# Patient Record
Sex: Female | Born: 1960 | Hispanic: No | Marital: Single
Health system: Southern US, Community
[De-identification: ages and names within clinical notes are randomized; demographics above are authoritative.]

---

## 2016-08-27 ENCOUNTER — Other Ambulatory Visit: Payer: Self-pay | Admitting: Internal Medicine

## 2016-08-27 DIAGNOSIS — N6320 Unspecified lump in the left breast, unspecified quadrant: Secondary | ICD-10-CM

## 2016-10-19 ENCOUNTER — Other Ambulatory Visit: Payer: Self-pay | Admitting: Internal Medicine

## 2016-10-19 DIAGNOSIS — N6320 Unspecified lump in the left breast, unspecified quadrant: Secondary | ICD-10-CM

## 2016-10-27 ENCOUNTER — Other Ambulatory Visit: Payer: Self-pay | Admitting: Internal Medicine

## 2016-10-27 DIAGNOSIS — N6320 Unspecified lump in the left breast, unspecified quadrant: Secondary | ICD-10-CM

## 2016-11-10 ENCOUNTER — Ambulatory Visit
Admission: RE | Admit: 2016-11-10 | Discharge: 2016-11-10 | Disposition: A | Discharge status: Home / Self Care | Payer: Medicare Other | Source: Ambulatory Visit | Attending: Internal Medicine | Admitting: Internal Medicine

## 2016-11-10 DIAGNOSIS — N6320 Unspecified lump in the left breast, unspecified quadrant: Secondary | ICD-10-CM

## 2016-12-08 ENCOUNTER — Ambulatory Visit
Admission: RE | Admit: 2016-12-08 | Discharge: 2016-12-08 | Disposition: A | Discharge status: Home / Self Care | Payer: Medicare Other | Source: Ambulatory Visit | Attending: Internal Medicine | Admitting: Internal Medicine

## 2016-12-08 DIAGNOSIS — N6320 Unspecified lump in the left breast, unspecified quadrant: Secondary | ICD-10-CM

## 2018-06-12 IMAGING — MG MM CLIP PLACEMENT
6 series · 6 of 14 positions shown · non-contrast
Comparison: Previous exam(s).

CLINICAL DATA: Post stereotactic core needle biopsy of left breast
mass.

EXAM:
DIAGNOSTIC LEFT MAMMOGRAM POST STEREOTACTIC BIOPSY

[L CC synth-2D]
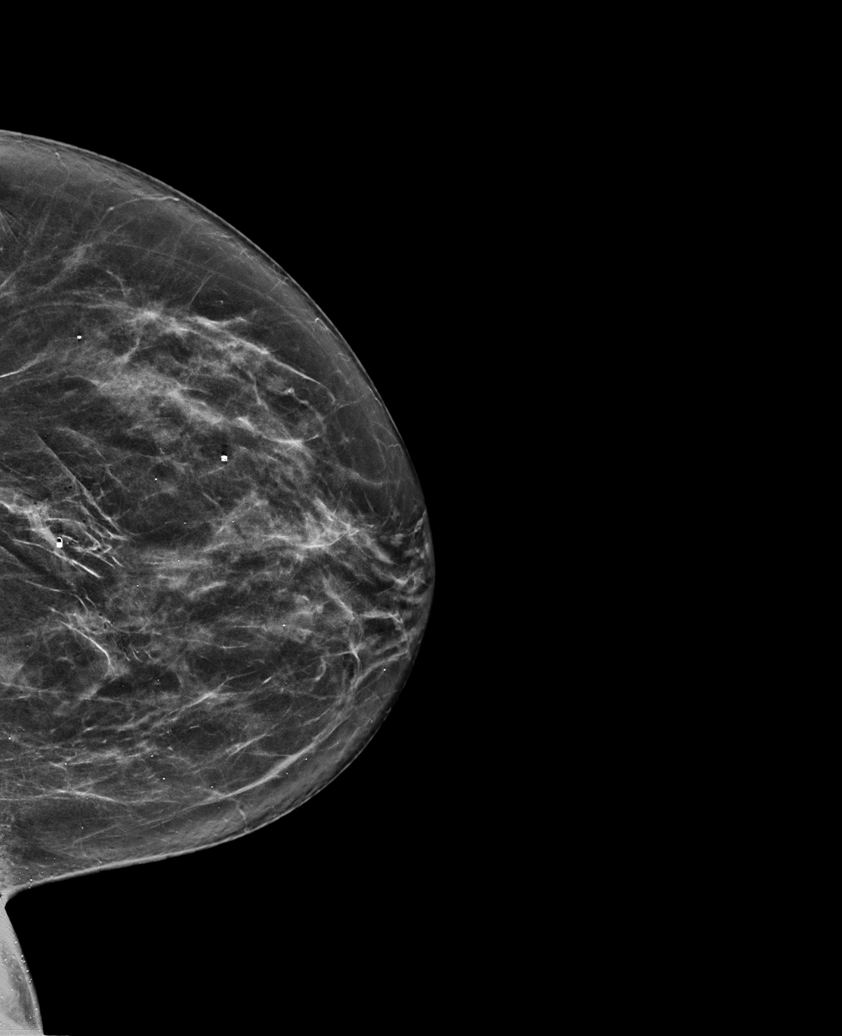

[L ML]
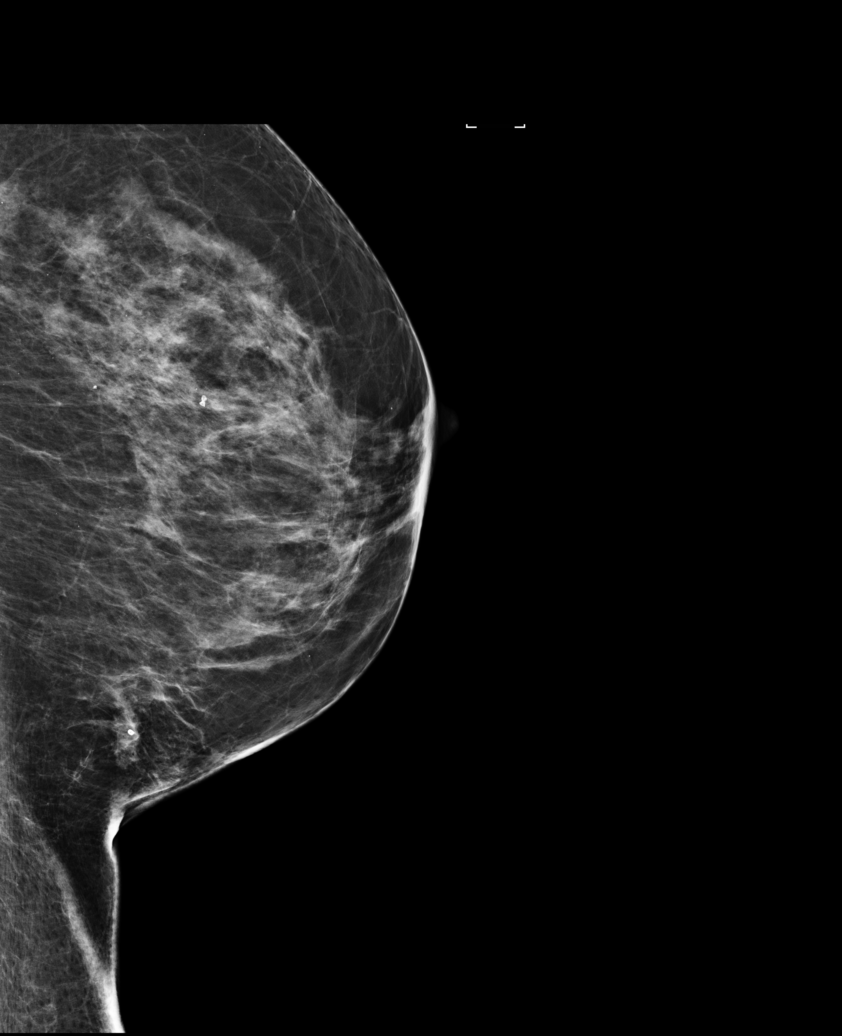

[L ML synth-2D]
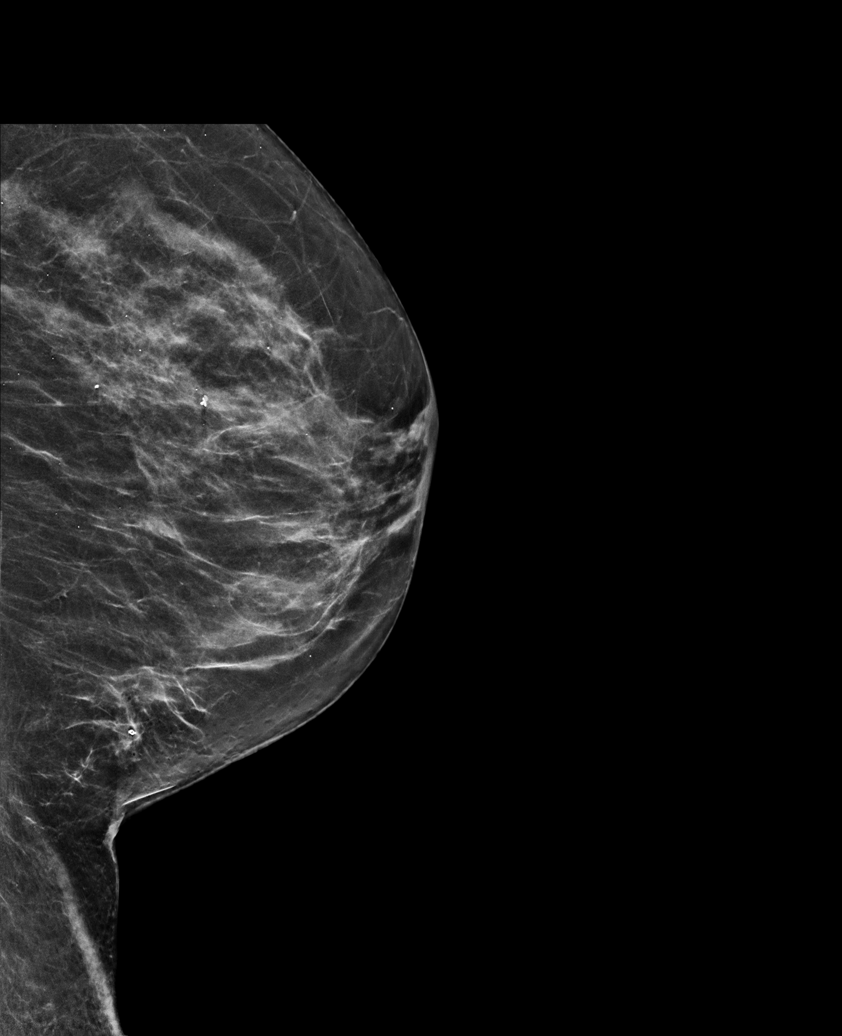

[L CC]
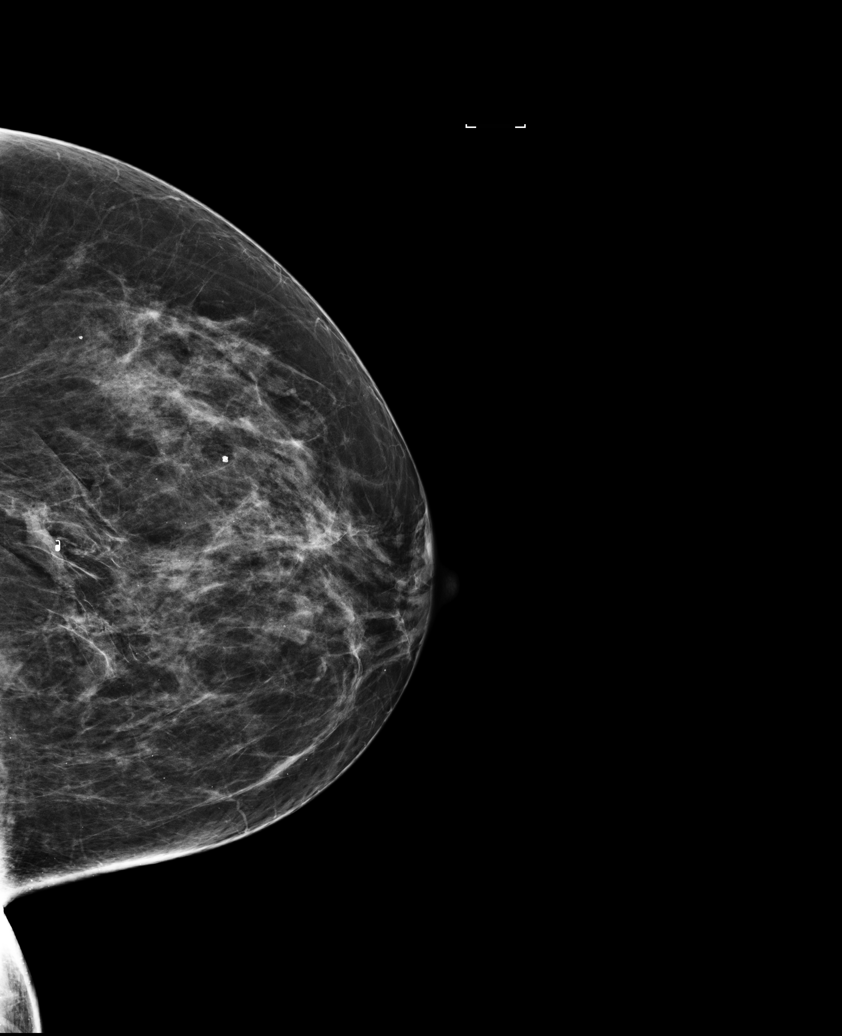

[L ML tomo · tomo slice 36/71.0]
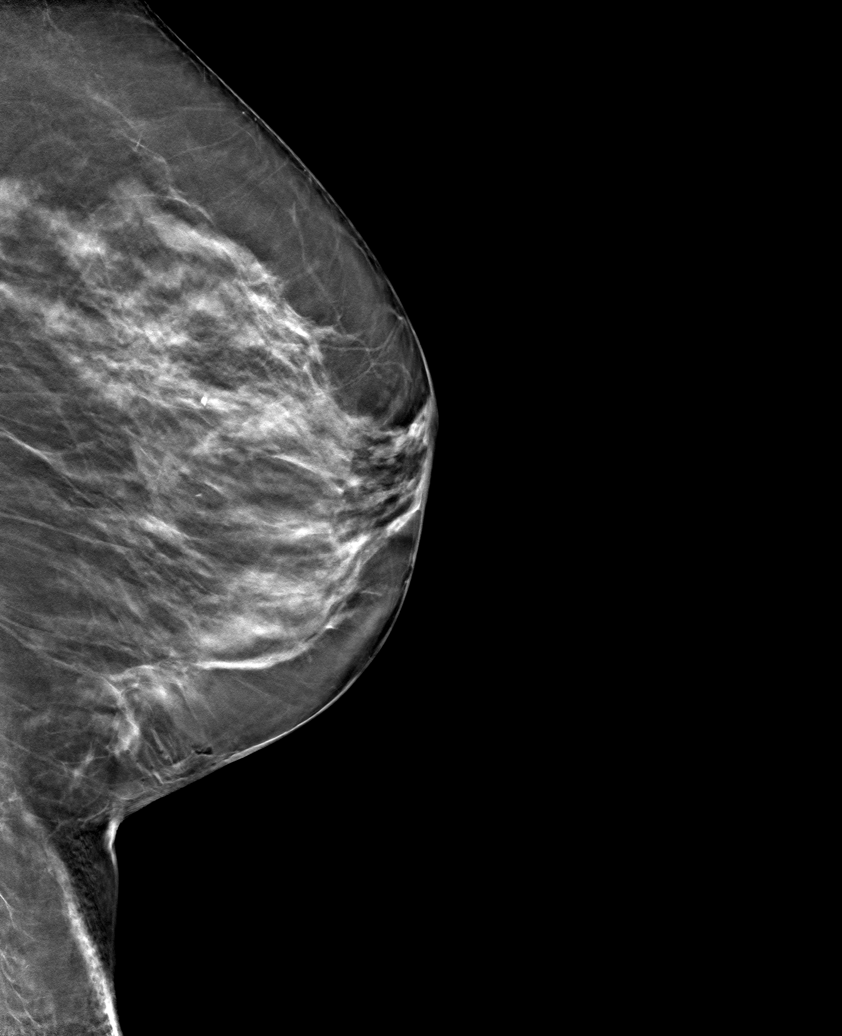

[L CC tomo · tomo slice 36/71.0]
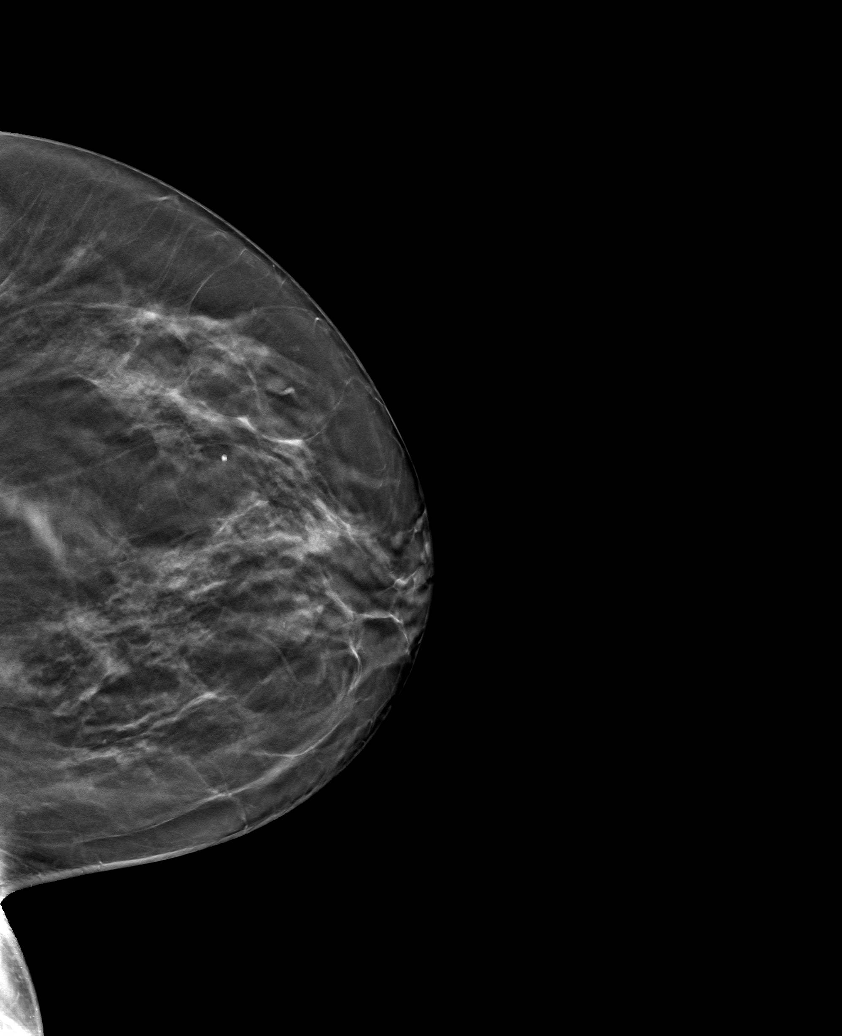

[6 of 14 positions shown; findings below may reference images not displayed]

FINDINGS: Mammographic images were obtained following stereotactic guided
biopsy of left lower central breast mass. Two-view mammography
demonstrates presence of coil shaped marker in expected mammographic
position.
IMPRESSION: Successful placement of coil shaped marker post stereotactic core
needle biopsy of left lower central breast mass.

Final Assessment: Post Procedure Mammograms for Marker Placement

## 2020-03-22 ENCOUNTER — Other Ambulatory Visit: Payer: Self-pay | Admitting: Hematology and Oncology

## 2020-03-22 DIAGNOSIS — C50412 Malignant neoplasm of upper-outer quadrant of left female breast: Secondary | ICD-10-CM

## 2020-03-22 LAB — HEPATIC FUNCTION PANEL
ALT: 25 (ref 7–35)
AST: 32 (ref 13–35)
Alkaline Phosphatase: 106 (ref 25–125)
Bilirubin, Total: 0.5

## 2020-03-22 LAB — CBC AND DIFFERENTIAL
HCT: 38 (ref 36–46)
Hemoglobin: 13 (ref 12.0–16.0)
Neutrophils Absolute: 5790
Platelets: 343 (ref 150–399)
WBC: 8.9

## 2020-03-22 LAB — BASIC METABOLIC PANEL
BUN: 10 (ref 4–21)
CO2: 25 — AB (ref 13–22)
Chloride: 104 (ref 99–108)
Creatinine: 0.6 (ref 0.5–1.1)
Glucose: 134
Potassium: 3.6 (ref 3.4–5.3)
Sodium: 140 (ref 137–147)

## 2020-03-22 LAB — CBC: RBC: 4.22 (ref 3.87–5.11)

## 2020-03-22 LAB — COMPREHENSIVE METABOLIC PANEL
Albumin: 4.4 (ref 3.5–5.0)
Calcium: 9.3 (ref 8.7–10.7)

## 2020-08-19 ENCOUNTER — Telehealth: Payer: Self-pay | Admitting: Oncology

## 2020-08-19 NOTE — Telephone Encounter (Signed)
08/19/20 Patient cancelled appt and did not want to reschedule.

## 2020-09-23 ENCOUNTER — Other Ambulatory Visit: Payer: Medicare Other

## 2020-09-23 ENCOUNTER — Ambulatory Visit: Payer: Medicare Other | Admitting: Oncology

## 2021-01-06 DEATH — deceased
